# Patient Record
Sex: Female | Born: 1965 | Race: Black or African American | Hispanic: No | Marital: Married | Smoking: Never smoker
Health system: Southern US, Community
[De-identification: ages and names within clinical notes are randomized; demographics above are authoritative.]

## PROBLEM LIST (undated history)

## (undated) DIAGNOSIS — F1021 Alcohol dependence, in remission: Secondary | ICD-10-CM

## (undated) DIAGNOSIS — F419 Anxiety disorder, unspecified: Secondary | ICD-10-CM

## (undated) HISTORY — PX: GASTRIC BYPASS: SHX52

## (undated) HISTORY — PX: NO PAST SURGERIES: SHX2092

---

## 1998-04-09 ENCOUNTER — Other Ambulatory Visit: Admission: RE | Admit: 1998-04-09 | Discharge: 1998-04-09 | Payer: Self-pay | Admitting: Gynecology

## 1998-09-06 ENCOUNTER — Other Ambulatory Visit: Admission: RE | Admit: 1998-09-06 | Discharge: 1998-09-06 | Payer: Self-pay | Admitting: Gynecology

## 1999-01-22 ENCOUNTER — Other Ambulatory Visit: Admission: RE | Admit: 1999-01-22 | Discharge: 1999-01-22 | Payer: Self-pay | Admitting: *Deleted

## 2000-05-13 ENCOUNTER — Other Ambulatory Visit: Admission: RE | Admit: 2000-05-13 | Discharge: 2000-05-13 | Payer: Self-pay | Admitting: Gynecology

## 2001-05-11 ENCOUNTER — Other Ambulatory Visit: Admission: RE | Admit: 2001-05-11 | Discharge: 2001-05-11 | Payer: Self-pay | Admitting: Gynecology

## 2002-09-07 ENCOUNTER — Other Ambulatory Visit: Admission: RE | Admit: 2002-09-07 | Discharge: 2002-09-07 | Payer: Self-pay | Admitting: Gynecology

## 2007-06-12 ENCOUNTER — Emergency Department: Payer: Self-pay | Admitting: Emergency Medicine

## 2008-04-30 ENCOUNTER — Ambulatory Visit: Payer: Self-pay | Admitting: Unknown Physician Specialty

## 2008-05-03 ENCOUNTER — Emergency Department: Payer: Self-pay | Admitting: Emergency Medicine

## 2008-08-31 ENCOUNTER — Ambulatory Visit: Payer: Self-pay | Admitting: Internal Medicine

## 2008-09-04 ENCOUNTER — Ambulatory Visit: Payer: Self-pay | Admitting: Family Medicine

## 2008-09-13 ENCOUNTER — Encounter: Payer: Self-pay | Admitting: *Deleted

## 2008-09-25 ENCOUNTER — Encounter: Payer: Self-pay | Admitting: *Deleted

## 2008-10-25 ENCOUNTER — Encounter: Payer: Self-pay | Admitting: *Deleted

## 2008-11-01 ENCOUNTER — Ambulatory Visit: Payer: Self-pay | Admitting: Internal Medicine

## 2009-05-06 ENCOUNTER — Ambulatory Visit: Payer: Self-pay | Admitting: Family Medicine

## 2010-04-26 ENCOUNTER — Ambulatory Visit: Payer: Self-pay | Admitting: General Practice

## 2014-01-27 ENCOUNTER — Ambulatory Visit: Payer: Self-pay | Admitting: Internal Medicine

## 2014-01-30 ENCOUNTER — Ambulatory Visit: Payer: Self-pay | Admitting: Family Medicine

## 2014-10-16 ENCOUNTER — Emergency Department
Admission: EM | Admit: 2014-10-16 | Discharge: 2014-10-16 | Disposition: A | Discharge status: Home / Self Care | Payer: No Typology Code available for payment source | Attending: Emergency Medicine | Admitting: Emergency Medicine

## 2014-10-16 ENCOUNTER — Encounter: Payer: Self-pay | Admitting: Emergency Medicine

## 2014-10-16 DIAGNOSIS — S161XXA Strain of muscle, fascia and tendon at neck level, initial encounter: Secondary | ICD-10-CM | POA: Diagnosis not present

## 2014-10-16 DIAGNOSIS — Y92410 Unspecified street and highway as the place of occurrence of the external cause: Secondary | ICD-10-CM | POA: Insufficient documentation

## 2014-10-16 DIAGNOSIS — S39012A Strain of muscle, fascia and tendon of lower back, initial encounter: Secondary | ICD-10-CM | POA: Insufficient documentation

## 2014-10-16 DIAGNOSIS — Y998 Other external cause status: Secondary | ICD-10-CM | POA: Insufficient documentation

## 2014-10-16 DIAGNOSIS — S199XXA Unspecified injury of neck, initial encounter: Secondary | ICD-10-CM | POA: Diagnosis present

## 2014-10-16 DIAGNOSIS — Y9389 Activity, other specified: Secondary | ICD-10-CM | POA: Diagnosis not present

## 2014-10-16 MED ORDER — TRAMADOL HCL 50 MG PO TABS: 50.0000 mg | ORAL_TABLET | Freq: Two times a day (BID) | ORAL | Status: DC

## 2014-10-16 MED ORDER — KETOROLAC TROMETHAMINE 10 MG PO TABS: 10.0000 mg | ORAL_TABLET | Freq: Three times a day (TID) | ORAL | Status: DC
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
## 2014-10-28 ENCOUNTER — Ambulatory Visit
Admission: EM | Admit: 2014-10-28 | Discharge: 2014-10-28 | Disposition: A | Discharge status: Home / Self Care | Payer: Managed Care, Other (non HMO)

## 2014-10-28 ENCOUNTER — Encounter: Payer: Self-pay | Admitting: Emergency Medicine

## 2014-10-28 DIAGNOSIS — M791 Myalgia, unspecified site: Secondary | ICD-10-CM
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
## 2014-11-09 ENCOUNTER — Other Ambulatory Visit: Payer: Self-pay | Admitting: Chiropractor

## 2014-11-09 ENCOUNTER — Ambulatory Visit
Admission: RE | Admit: 2014-11-09 | Discharge: 2014-11-09 | Disposition: A | Discharge status: Home / Self Care | Payer: No Typology Code available for payment source | Source: Ambulatory Visit | Attending: Chiropractor | Admitting: Chiropractor

## 2014-11-09 DIAGNOSIS — R262 Difficulty in walking, not elsewhere classified: Secondary | ICD-10-CM | POA: Diagnosis not present

## 2014-11-09 DIAGNOSIS — G44319 Acute post-traumatic headache, not intractable: Secondary | ICD-10-CM

## 2014-11-09 DIAGNOSIS — R51 Headache: Secondary | ICD-10-CM | POA: Insufficient documentation

## 2014-11-09 DIAGNOSIS — R110 Nausea: Secondary | ICD-10-CM

## 2014-11-10 ENCOUNTER — Ambulatory Visit
Admission: RE | Admit: 2014-11-10 | Discharge: 2014-11-10 | Disposition: A | Discharge status: Home / Self Care | Payer: No Typology Code available for payment source | Source: Ambulatory Visit | Attending: Chiropractor | Admitting: Chiropractor

## 2014-11-10 ENCOUNTER — Other Ambulatory Visit: Payer: Self-pay | Admitting: Chiropractor

## 2014-11-10 DIAGNOSIS — M25562 Pain in left knee: Secondary | ICD-10-CM | POA: Diagnosis present

## 2014-11-10 DIAGNOSIS — S99911A Unspecified injury of right ankle, initial encounter: Secondary | ICD-10-CM

## 2014-11-10 DIAGNOSIS — M25561 Pain in right knee: Secondary | ICD-10-CM | POA: Diagnosis present

## 2014-11-10 DIAGNOSIS — M4316 Spondylolisthesis, lumbar region: Secondary | ICD-10-CM | POA: Insufficient documentation

## 2014-11-10 DIAGNOSIS — M25572 Pain in left ankle and joints of left foot: Secondary | ICD-10-CM | POA: Insufficient documentation

## 2014-11-10 DIAGNOSIS — M542 Cervicalgia: Secondary | ICD-10-CM

## 2014-11-10 DIAGNOSIS — M546 Pain in thoracic spine: Secondary | ICD-10-CM

## 2014-11-10 DIAGNOSIS — S99912A Unspecified injury of left ankle, initial encounter: Secondary | ICD-10-CM

## 2014-11-10 DIAGNOSIS — M545 Low back pain: Secondary | ICD-10-CM

## 2014-11-10 DIAGNOSIS — M7731 Calcaneal spur, right foot: Secondary | ICD-10-CM | POA: Insufficient documentation

## 2014-11-10 DIAGNOSIS — M25571 Pain in right ankle and joints of right foot: Secondary | ICD-10-CM | POA: Diagnosis not present

## 2014-11-10 DIAGNOSIS — M47816 Spondylosis without myelopathy or radiculopathy, lumbar region: Secondary | ICD-10-CM | POA: Diagnosis not present

## 2014-11-10 DIAGNOSIS — S8991XA Unspecified injury of right lower leg, initial encounter: Secondary | ICD-10-CM

## 2014-11-10 DIAGNOSIS — S8992XA Unspecified injury of left lower leg, initial encounter: Secondary | ICD-10-CM

## 2015-08-23 ENCOUNTER — Ambulatory Visit
Admission: RE | Admit: 2015-08-23 | Discharge: 2015-08-23 | Disposition: A | Discharge status: Home / Self Care | Payer: No Typology Code available for payment source | Source: Ambulatory Visit | Attending: Family Medicine | Admitting: Family Medicine

## 2015-08-23 ENCOUNTER — Other Ambulatory Visit: Payer: Self-pay | Admitting: Family Medicine

## 2015-08-23 DIAGNOSIS — Z9889 Other specified postprocedural states: Secondary | ICD-10-CM | POA: Insufficient documentation

## 2015-08-23 DIAGNOSIS — R52 Pain, unspecified: Secondary | ICD-10-CM

## 2015-08-23 DIAGNOSIS — D259 Leiomyoma of uterus, unspecified: Secondary | ICD-10-CM | POA: Insufficient documentation

## 2015-08-23 DIAGNOSIS — M25552 Pain in left hip: Secondary | ICD-10-CM | POA: Insufficient documentation

## 2016-09-02 ENCOUNTER — Ambulatory Visit
Admission: RE | Admit: 2016-09-02 | Discharge: 2016-09-02 | Disposition: A | Discharge status: Home / Self Care | Payer: Self-pay | Source: Ambulatory Visit | Attending: General Surgery | Admitting: General Surgery

## 2016-09-02 ENCOUNTER — Other Ambulatory Visit: Payer: Self-pay | Admitting: Physician Assistant

## 2016-09-02 ENCOUNTER — Ambulatory Visit
Admission: RE | Admit: 2016-09-02 | Discharge: 2016-09-02 | Disposition: A | Discharge status: Home / Self Care | Payer: No Typology Code available for payment source | Source: Ambulatory Visit | Attending: Physician Assistant | Admitting: Physician Assistant

## 2016-09-02 DIAGNOSIS — S6992XA Unspecified injury of left wrist, hand and finger(s), initial encounter: Secondary | ICD-10-CM | POA: Diagnosis not present

## 2016-09-02 DIAGNOSIS — R937 Abnormal findings on diagnostic imaging of other parts of musculoskeletal system: Secondary | ICD-10-CM | POA: Diagnosis not present

## 2016-09-02 DIAGNOSIS — X58XXXA Exposure to other specified factors, initial encounter: Secondary | ICD-10-CM | POA: Diagnosis not present

## 2016-09-28 ENCOUNTER — Ambulatory Visit
Admission: EM | Admit: 2016-09-28 | Discharge: 2016-09-28 | Disposition: A | Discharge status: Home / Self Care | Payer: 59 | Attending: Family Medicine | Admitting: Family Medicine

## 2016-09-28 DIAGNOSIS — S01511A Laceration without foreign body of lip, initial encounter: Secondary | ICD-10-CM

## 2016-09-28 DIAGNOSIS — S00531A Contusion of lip, initial encounter: Secondary | ICD-10-CM

## 2016-09-28 MED ORDER — AMOXICILLIN-POT CLAVULANATE 875-125 MG PO TABS: 1.0000 | ORAL_TABLET | Freq: Two times a day (BID) | ORAL | 0 refills | Status: DC

## 2016-09-28 MED ORDER — MUPIROCIN 2 % EX OINT: TOPICAL_OINTMENT | CUTANEOUS | 0 refills | Status: DC

## 2016-09-28 MED ORDER — HYDROCODONE-ACETAMINOPHEN 5-325 MG PO TABS: 1.0000 | ORAL_TABLET | Freq: Three times a day (TID) | ORAL | 0 refills | Status: DC | PRN
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
## 2016-09-28 NOTE — Discharge Instructions (Signed)
Take medication as prescribed. Rest. Drink plenty of fluids. Rinse mouth frequently. Keep clean. Monitor closely.   Follow up with your primary care physician or Ear Nose Throat in 2-3 days as needed. Return to Urgent care for new or worsening concerns.

## 2016-09-28 NOTE — ED Provider Notes (Signed)
[facility] ____________________________________________  Time seen: Approximately 9:12 AM  I have reviewed the triage vital signs and the nursing notes.   HISTORY  Chief Complaint Lip Laceration   HPI Brittany Holder is a 51 y.o. female  presenting for evaluation of lip laceration that occurred 2 days ago. Patient states that Saturday evening she was goofing off and playing with her dog area patient states that she tripped over the dog and hit the door frame with her lip. States only injury to her lip. Denies actual head injury or loss of consciousness. Denies any dental injury or bony injury. Denies any jaw, mandible or other facial tenderness. States her lip does have a throbbing pain, 7 out of 10. States has occasionally taken some over-the-counter ibuprofen and use peroxide to clean the area. Denies other alleviating measures being taken. Has not had this seen or looked at prior to arrival today. Denies other injury. Denies other complaints.  Denies chest pain, shortness of breath, abdominal pain, dysuria, extremity pain, extremity swelling or rash. Denies recent sickness. Denies recent antibiotic use.   Patient's last menstrual period was 09/14/2016. Denies pregnancy Reports last Tetanus immunization: Patient states unsure of last tetanus immunization, believes it may be up to date.  Sofie Hartigan, MD: PCP   History reviewed. No pertinent past medical history.  There are no active problems to display for this patient.   Past Surgical History:  Procedure Laterality Date  . NO PAST SURGERIES       No current facility-administered medications for this encounter.   Current Outpatient Prescriptions:  .  amoxicillin-clavulanate (AUGMENTIN) 875-125 MG tablet, Take 1 tablet by mouth every 12 (twelve) hours., Disp: 20 tablet, Rfl: 0 .  HYDROcodone-acetaminophen (NORCO/VICODIN) 5-325 MG tablet, Take 1 tablet by mouth every 8 (eight) hours as needed for  moderate pain or severe pain (do not drive or operate machinery while taking as can cause drowsiness)., Disp: 3 tablet, Rfl: 0 .  mupirocin ointment (BACTROBAN) 2 %, Apply three times a day for 5 days., Disp: 22 g, Rfl: 0  Allergies Patient has no known allergies.  History reviewed. No pertinent family history.  Social History Social History  Substance Use Topics  . Smoking status: Never Smoker  . Smokeless tobacco: Never Used  . Alcohol use No    Review of Systems Constitutional: No fever/chills Eyes: No visual changes. ENT: No sore throat. Cardiovascular: Denies chest pain. Respiratory: Denies shortness of breath. Gastrointestinal: No abdominal pain.  No nausea, no vomiting.  No diarrhea.  No constipation. Musculoskeletal: Negative for back pain. Skin: As above. Denies other skin injuries.  Neurological: Negative for headaches, focal weakness or numbness.    ____________________________________________   PHYSICAL EXAM:  VITAL SIGNS: ED Triage Vitals  Enc Vitals Group     BP 09/28/16 0853 118/80     Pulse Rate 09/28/16 0853 77     Resp 09/28/16 0853 18     Temp 09/28/16 0853 98.2 F (36.8 C)     Temp Source 09/28/16 0853 Oral     SpO2 09/28/16 0853 100 %     Weight 09/28/16 0851 235 lb (106.6 kg)     Height 09/28/16 0851 5\' 8"  (1.727 m)     Head Circumference --      Peak Flow --      Pain Score 09/28/16 0851 8     Pain Loc --      Pain Edu? --      Excl. in [place]? --  Constitutional: Alert and oriented. Well appearing and in no acute distress. Eyes: Conjunctivae are normal. PERRL.  ENT      Head: Normocephalic. Nontender.       Ears: no erythema, normal TMs bilaterally. No surrounding tenderness, erythema or swelling bilaterally.       Nose: No congestion/rhinnorhea. Nontender.      Mouth/Throat: Mucous membranes are moist.Oropharynx non-erythematous. No dental tenderness or loose teeth noted. Bottom lip with healing laceration present, laceration  external lower mid lip approximately 2 cm and extends just inferiorly past vermilion border with crusted lesion present. Inner lower mid lip with healing laceration with whitish tissue base approximately 1.5 cm. Neither wounds with any surrounding erythema, or drainage. Mild to moderate tenderness to lower lip laceration site only, with induration palpated between lacerations. Unable to determine if through and through laceration. No other facial or surrounding tenderness.   Neck: No stridor. Supple without meningismus.  Hematological/Lymphatic/Immunilogical: No cervical lymphadenopathy. Cardiovascular: Normal rate, regular rhythm. Grossly normal heart sounds.  Good peripheral circulation. Respiratory: Normal respiratory effort without tachypnea nor retractions. Breath sounds are clear and equal bilaterally. No wheezes, rales, rhonchi. Musculoskeletal:  Steady gait. No midline cervical, thoracic or lumbar tenderness to palpation.  Neurologic:  Normal speech and language. No gross focal neurologic deficits are appreciated. Speech is normal. No gait instability.  Skin:  Skin is warm, dry. Psychiatric: Mood and affect are normal. Speech and behavior are normal. Patient exhibits appropriate insight and judgment   ___________________________________________   LABS (all labs ordered are listed, but only abnormal results are displayed)  Labs Reviewed - No data to display   PROCEDURES Procedures   INITIAL IMPRESSION / ASSESSMENT AND PLAN / ED COURSE  Pertinent labs & imaging results that were available during my care of the patient were reviewed by me and considered in my medical decision making (see chart for details).  Well appearing patient. No acute distress. Lip laceration sustained 2 days ago. Concerned laceration was through and through but unable to determine. Laceration through vermilion border. No repair indicated at this time. Concerned for secondary infection. Will start patient on oral  Augmentin and topical Bactroban for area of lower vermilion border. Discussed keeping area clean and rinsing frequently. Will encourage primary care or Ear nose and throat follow-up in 2-3 days. Patient states having some pain when trying to go to sleep at night and Norco quantity 3 given. Discussed indication, risks and benefits of medications with patient.    [place] controlled substance database reviewed for last one year, and most recent controlled substance documented 09/11/2016 quantity #3 10 mg Norco given.  Discussed follow up with Primary care physician this week. Discussed follow up and return parameters including no resolution or any worsening concerns. Patient verbalized understanding and agreed to plan.   Per Izora Gala RN, patient stated that she believes tetanus immunization was up-to-date and if not will return for tetanus immunization.  ____________________________________________   FINAL CLINICAL IMPRESSION(S) / ED DIAGNOSES  Final diagnoses:  Lip laceration, initial encounter  Contusion of lip, initial encounter     Discharge Medication List as of 09/28/2016  9:23 AM    START taking these medications   Details  amoxicillin-clavulanate (AUGMENTIN) 875-125 MG tablet Take 1 tablet by mouth every 12 (twelve) hours., Starting Mon 09/28/2016, Normal    HYDROcodone-acetaminophen (NORCO/VICODIN) 5-325 MG tablet Take 1 tablet by mouth every 8 (eight) hours as needed for moderate pain or severe pain (do not drive or operate machinery while taking as  can cause drowsiness)., Starting Mon 09/28/2016, Print    mupirocin ointment (BACTROBAN) 2 % Apply three times a day for 5 days., Normal        Note: This dictation was prepared with Dragon dictation along with smaller phrase technology. Any transcriptional errors that result from this process are unintentional.         Marylene Land, NP 10/01/16 1253

## 2016-10-01 ENCOUNTER — Telehealth: Payer: Self-pay | Admitting: Emergency Medicine

## 2016-10-01 NOTE — Telephone Encounter (Signed)
Attempted to call patient to follow-up, and to follow-up determining if patient tetanus immunization is up-to-date. Izora Gala RN, reported that patient called back on date of visit and mentioned that tetanus immunization may not be up-to-date and was going to stop back by the urgent care for immunization. No note found that patient returned. I have tried calling the patient 3 times and have been unable to reach. Message left. Unclear if tetanus immunization is up-to-date.

## 2017-01-10 ENCOUNTER — Encounter: Payer: Self-pay | Admitting: Gynecology

## 2017-01-10 ENCOUNTER — Ambulatory Visit
Admission: EM | Admit: 2017-01-10 | Discharge: 2017-01-10 | Disposition: A | Discharge status: Home / Self Care | Payer: BLUE CROSS/BLUE SHIELD | Attending: Family Medicine | Admitting: Family Medicine

## 2017-01-10 DIAGNOSIS — F419 Anxiety disorder, unspecified: Secondary | ICD-10-CM | POA: Diagnosis not present

## 2017-01-10 HISTORY — DX: Anxiety disorder, unspecified: F41.9

## 2017-01-10 HISTORY — DX: Alcohol dependence, in remission: F10.21

## 2017-01-10 MED ORDER — ALPRAZOLAM 0.25 MG PO TABS: 0.2500 mg | ORAL_TABLET | Freq: Three times a day (TID) | ORAL | 0 refills | Status: DC | PRN

## 2017-01-10 NOTE — ED Provider Notes (Addendum)
[facility] ____________________________________________  Time seen: Approximately 2:40 PM  I have reviewed the triage vital signs and the nursing notes.   HISTORY  Chief Complaint Anxiety   HPI Brittany Holder is a 51 y.o. female presenting for evaluation of her anxiety. Patient states she has chronic anxiety, and reports that she hasn't been under more stress recently at work and feels that this is worsening her anxiety and stress levels. Denies home stress related triggers. 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 Patient reports that she used to take daily Prozac, however reports that she stopped this about a month ago as she reports it had not been working for her. States also in the past she has taken Vistaril with minimal improvement as well as Xanax. Patient reports Xanax worked where very well for her in the past. States that she has a follow-up with her primary care doctor this Wednesday for the same complaints. Patient states her anxiety causes her to feel jittery and that her heart rate increases, but once her anxiety improved symptoms fully resolved. Denies any chest pain, shortness of breath or other complaints. Patient states this is her normal presentation for anxiety. Denies suicidal or homicidal ideation. Reports continues to eat and drink well. Denies recent sickness, cough, congestion or fevers.  Denies chest pain, shortness of breath, abdominal pain, extremity pain, extremity swelling. Denies recent sickness. Denies recent antibiotic use. States does not drink alcohol. Denies drug use.    Past Medical History:  Diagnosis Date  . Alcoholism in recovery ([place])   . Anxiety     There are no active problems to display for this patient.   Past Surgical History:  Procedure Laterality Date  . GASTRIC BYPASS    . NO PAST SURGERIES      No current facility-administered medications for this encounter.    Current Outpatient Prescriptions:  .  FLUoxetine (PROZAC) 40 MG capsule, Take 40 mg by mouth daily., Disp: , Rfl:  .  ALPRAZolam (XANAX) 0.25 MG tablet, Take 1 tablet (0.25 mg total) by mouth 3 (three) times daily as needed for anxiety., Disp: 8 tablet, Rfl: 0 .  amoxicillin-clavulanate (AUGMENTIN) 875-125 MG tablet, Take 1 tablet by mouth every 12 (twelve) hours., Disp: 20 tablet, Rfl: 0 .  HYDROcodone-acetaminophen (NORCO/VICODIN) 5-325 MG tablet, Take 1 tablet by mouth every 8 (eight) hours as needed for moderate pain or severe pain (do not drive or operate machinery while taking as can cause drowsiness)., Disp: 3 tablet, Rfl: 0 .  mupirocin ointment (BACTROBAN) 2 %, Apply three times a day for 5 days., Disp: 22 g, Rfl: 0  Allergies Patient has no known allergies.  No family history on file.  Social History Social History  Substance Use Topics  . Smoking status: Never Smoker  . Smokeless tobacco: Never Used  . Alcohol use No    Review of Systems Constitutional: No fever/chills Cardiovascular: Denies chest pain. Respiratory: Denies shortness of breath. Gastrointestinal: No abdominal pain.  No nausea, no vomiting.   Genitourinary: Negative for dysuria. Musculoskeletal: Negative for back pain. Skin: Negative for rash. Neurological: Negative for headaches, focal weakness or numbness.  ____________________________________________   PHYSICAL EXAM:  VITAL SIGNS: ED Triage Vitals  Enc Vitals Group     BP 01/10/17 1421 107/66     Pulse Rate 01/10/17 1421 99     Resp 01/10/17 1421 16     Temp 01/10/17 1421 98.1 F (36.7 C)  Temp Source 01/10/17 1421 Oral     SpO2 01/10/17 1421 100 %     Weight 01/10/17 1418 235 lb (106.6 kg)     Height 01/10/17 1418 5\' 8"  (1.727 m)     Head Circumference --      Peak Flow --      Pain Score 01/10/17 1418 7     Pain Loc --      Pain Edu? --      Excl. in [place]? --     Constitutional: Alert and oriented. Well appearing and in no  acute distress. Cardiovascular: Normal rate, regular rhythm. Grossly normal heart sounds.  Good peripheral circulation. Respiratory: Normal respiratory effort without tachypnea nor retractions. Breath sounds are clear and equal bilaterally. No wheezes, rales, rhonchi. Musculoskeletal: Steady gait. No midline cervical, thoracic or lumbar tenderness to palpation.  Neurologic:  Normal speech and language.  Speech is normal. No gait instability.  Skin:  Skin is warm, dry. Psychiatric: Mood and affect are normal. Speech and behavior are normal. Patient exhibits appropriate insight and judgment   ___________________________________________   LABS (all labs ordered are listed, but only abnormal results are displayed)  Labs Reviewed - No data to display  PROCEDURES Procedures    INITIAL IMPRESSION / ASSESSMENT AND PLAN / ED COURSE  Pertinent labs & imaging results that were available during my care of the patient were reviewed by me and considered in my medical decision making (see chart for details).  Very well-appearing patient. No acute distress. Patient with chronic anxiety, and reports recent increase in her anxiety related to stress at work. Patient had recently stopped her Prozac today stating that it was not working for her. Patient has appointment with PCP this week, encouraged to keep this appointment. Patient states current complaints consistent with her anxiety. Declines ecg, and states "its only my anxiety." We'll give quantity 8 0.25 Xanax tablets, and discussed these sparingly. Encouraged rest, fluids, daily exercise and avoidance of stressful triggers as able.Discussed indication, risks and benefits of medications with patient, including do not drive while taking, or consume with alcohol or pain medication.    [place] controlled substance database reviewed, and most recent controlled medications documented 12/16/16 # 15 hydrocodone and 10/11/16 # 3 hydrocodone.  Discussed  follow up with Primary care physician this week. Discussed follow up and return parameters including no resolution or any worsening concerns. Patient verbalized understanding and agreed to plan.   ____________________________________________   FINAL CLINICAL IMPRESSION(S) / ED DIAGNOSES  Final diagnoses:  Anxiety     Discharge Medication List as of 01/10/2017  2:54 PM    START taking these medications   Details  ALPRAZolam (XANAX) 0.25 MG tablet Take 1 tablet (0.25 mg total) by mouth 3 (three) times daily as needed for anxiety., Starting Sun 01/10/2017, Print        Note: This dictation was prepared with Dragon dictation along with smaller phrase technology. Any transcriptional errors that result from this process are unintentional.         Marylene Land, NP 01/10/17 [place], [place], NP 01/10/17 [phone]

## 2017-01-10 NOTE — ED Triage Notes (Signed)
Per patient seen by her primary care in August for anxiety. Per patient was put on Prozac 40 mg, which is no tworking for her. Patient stated she use to be on Xanax which work better for her.

## 2017-01-10 NOTE — Discharge Instructions (Signed)
Take medication as prescribed. Rest. Drink plenty of fluids. Avoid stress triggers as able. Exercise regularly.  Follow up with your primary care physician this week as scheduled. Return to Urgent care for new or worsening concerns.

## 2017-06-09 ENCOUNTER — Telehealth: Payer: Self-pay | Admitting: Emergency Medicine

## 2017-06-09 ENCOUNTER — Ambulatory Visit
Admission: EM | Admit: 2017-06-09 | Discharge: 2017-06-09 | Disposition: A | Discharge status: Home / Self Care | Payer: BLUE CROSS/BLUE SHIELD | Attending: Family Medicine | Admitting: Family Medicine

## 2017-06-09 ENCOUNTER — Other Ambulatory Visit: Payer: Self-pay

## 2017-06-09 DIAGNOSIS — S61451A Open bite of right hand, initial encounter: Secondary | ICD-10-CM | POA: Diagnosis not present

## 2017-06-09 DIAGNOSIS — W540XXA Bitten by dog, initial encounter: Secondary | ICD-10-CM

## 2017-06-09 MED ORDER — METRONIDAZOLE 500 MG PO TABS: 500.0000 mg | ORAL_TABLET | Freq: Three times a day (TID) | ORAL | 0 refills | Status: DC

## 2017-06-09 MED ORDER — TRAMADOL HCL 50 MG PO TABS: 50.0000 mg | ORAL_TABLET | Freq: Three times a day (TID) | ORAL | 0 refills | Status: DC | PRN

## 2017-06-09 MED ORDER — DOXYCYCLINE HYCLATE 100 MG PO CAPS: 100.0000 mg | ORAL_CAPSULE | Freq: Two times a day (BID) | ORAL | 0 refills | Status: DC

## 2017-06-09 NOTE — ED Triage Notes (Signed)
Pt was horse playing with some dogs in [employer] in [place] and was bit on Saturday. She rates the pain a 8/10, right is swollen and has some old drainage present.

## 2017-06-09 NOTE — ED Provider Notes (Signed)
[facility]    CSN: [id number] Arrival date & time: 06/09/17  1748  History   Chief Complaint Dog bite.  HPI  52 year old female presents with dog bite.  Patient states that on Saturday she was at a local pet store and was bit in the palm of her hand by a dog.  She currently has an open wound in the palm of her hand.  She states that her pain is 8/10 in severity.  Mild swelling.  Ongoing drainage.  No fevers or chills.  She is been taking some leftover Augmentin without resolution.  No known exacerbating factors.  No other associated symptoms.  No other complaints at time.  Past Medical History:  Diagnosis Date  . Alcoholism in recovery ([place])   . Anxiety    Past Surgical History:  Procedure Laterality Date  . GASTRIC BYPASS           OB History    No data available       Home Medications    Prior to Admission medications   Medication Sig Start Date End Date Taking? Authorizing Provider  ALPRAZolam (XANAX) 0.25 MG tablet Take 1 tablet (0.25 mg total) by mouth 3 (three) times daily as needed for anxiety. 01/10/17   Marylene Land, NP  doxycycline (VIBRAMYCIN) 100 MG capsule Take 1 capsule (100 mg total) by mouth 2 (two) times daily. 06/09/17   Coral Spikes, DO  FLUoxetine (PROZAC) 40 MG capsule Take 40 mg by mouth daily.    [provider]  metroNIDAZOLE (FLAGYL) 500 MG tablet Take 1 tablet (500 mg total) by mouth 3 (three) times daily. 06/09/17   Coral Spikes, DO  traMADol (ULTRAM) 50 MG tablet Take 1 tablet (50 mg total) by mouth every 8 (eight) hours as needed. 06/09/17   Coral Spikes, DO   Family History History reviewed. No pertinent family history.  Social History Social History   Tobacco Use  . Smoking status: Never Smoker  . Smokeless tobacco: Never Used  Substance Use Topics  . Alcohol use: No  . Drug use: No     Allergies   Patient has no known allergies.   Review of Systems Review of Systems  Constitutional: Negative.     Skin: Positive for wound.   Physical Exam Triage Vital Signs ED Triage Vitals  Enc Vitals Group     BP 06/09/17 1823 140/86     Pulse Rate 06/09/17 1823 78     Resp --      Temp 06/09/17 1823 98.1 F (36.7 C)     Temp Source 06/09/17 1823 Oral     SpO2 06/09/17 1823 100 %     Weight 06/09/17 1826 240 lb (108.9 kg)     Height --      Head Circumference --      Peak Flow --      Pain Score 06/09/17 1826 8     Pain Loc --      Pain Edu? --      Excl. in [place]? --    Updated Vital Signs BP 140/86 (BP Location: Left Arm)   Pulse 78   Temp 98.1 F (36.7 C) (Oral)   Wt 240 lb (108.9 kg)   SpO2 100%   BMI 36.49 kg/m   Physical Exam  Constitutional: She is oriented to person, place, and time. She appears well-developed and well-nourished. No distress.  Cardiovascular: Normal rate and regular rhythm.  Pulmonary/Chest: Effort normal and breath sounds  normal.  Neurological: She is alert and oriented to person, place, and time.  Skin:  Right hand with a 2 cm open wound with drainage.  Mild swelling.  Minimal erythema.  Psychiatric: She has a normal mood and affect. Her behavior is normal.  Nursing note and vitals reviewed.  UC Treatments / Results  Labs (all labs ordered are listed, but only abnormal results are displayed) Labs Reviewed - No data to display  EKG  EKG Interpretation None       Radiology No results found.  Procedures Procedures (including critical care time)  Medications Ordered in UC Medications - No data to display   Initial Impression / Assessment and Plan / UC Course  I have reviewed the triage vital signs and the nursing notes.  Pertinent labs & imaging results that were available during my care of the patient were reviewed by me and considered in my medical decision making (see chart for details).     52 year old female presents with a dog bite/wound.  I informed her that closure is not a good option at this time.  Placing on doxycycline  and Flagyl.  Tramadol for pain.  If she fails to improve or worsens, she will need surgical consultation.  Final Clinical Impressions(s) / UC Diagnoses   Final diagnoses:  Dog bite, initial encounter    ED Discharge Orders        Ordered    doxycycline (VIBRAMYCIN) 100 MG capsule  2 times daily     06/09/17 1850    metroNIDAZOLE (FLAGYL) 500 MG tablet  3 times daily     06/09/17 1850    traMADol (ULTRAM) 50 MG tablet  Every 8 hours PRN     06/09/17 1851     Controlled Substance Prescriptions [place] Controlled Substance Registry consulted? No   Coral Spikes, [place] 06/09/17 4008

## 2017-06-09 NOTE — Discharge Instructions (Signed)
If no improvement in the next 2 days, you need to go to the hospital.  Antibiotics as prescribed.  Take care  Dr. Lacinda Axon

## 2017-06-09 NOTE — Telephone Encounter (Signed)
Pharmacist called stating patient was there to pick up prescriptions written by Dr. Lacinda Axon (Flagyl and Ultram) and that there is an interaction between Antabuse 250mg  and Flagyl. Advised pharmacy she did not tell us that she was on Antabuse. Pharmacy also stated that she had two narcotic pain medication prescriptions filled on 05/28/17 and 06/04/17 for 5 days supply each from a dentist. Spoke with Dr. Lacinda Axon, he stated to cancel Flagyl prescription and the Ultram prescription. Pharmacist agreed and voiced understanding. He will advise patient. Antabuse 250mg  daily added to patient's medication list.
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
## 2019-07-28 ENCOUNTER — Ambulatory Visit
Admission: EM | Admit: 2019-07-28 | Discharge: 2019-07-28 | Disposition: A | Discharge status: Home / Self Care | Payer: BC Managed Care – PPO | Attending: Family Medicine | Admitting: Family Medicine

## 2019-07-28 ENCOUNTER — Encounter: Payer: Self-pay | Admitting: Emergency Medicine

## 2019-07-28 ENCOUNTER — Other Ambulatory Visit: Payer: Self-pay

## 2019-07-28 DIAGNOSIS — R1013 Epigastric pain: Secondary | ICD-10-CM

## 2019-07-28 LAB — BASIC METABOLIC PANEL
Anion gap: 15 (ref 5–15)
BUN: 19 mg/dL (ref 6–20)
CO2: 19 mmol/L — ABNORMAL LOW (ref 22–32)
Calcium: 8.3 mg/dL — ABNORMAL LOW (ref 8.9–10.3)
Chloride: 100 mmol/L (ref 98–111)
Creatinine, Ser: 0.85 mg/dL (ref 0.44–1.00)
GFR calc Af Amer: >60 mL/min (ref >60)
GFR calc non Af Amer: >60 mL/min (ref >60)
Glucose, Bld: 97 mg/dL (ref 70–99)
Potassium: 4 mmol/L (ref 3.5–5.1)
Sodium: 134 mmol/L — ABNORMAL LOW (ref 135–145)

## 2019-07-28 LAB — LIPASE, BLOOD: Lipase: 15 U/L (ref 11–51)

## 2019-07-28 MED ORDER — LIDOCAINE VISCOUS HCL 2 % MT SOLN
15.0000 mL | Freq: Once | OROMUCOSAL | Status: AC
  Administered 2019-07-28: 15 mL via ORAL

## 2019-07-28 MED ORDER — ALUM & MAG HYDROXIDE-SIMETH 200-200-20 MG/5ML PO SUSP
30.0000 mL | Freq: Once | ORAL | Status: AC
  Administered 2019-07-28: 30 mL via ORAL

## 2019-07-28 NOTE — Discharge Instructions (Signed)
Over the counter Prilosec or Nexium

## 2019-07-28 NOTE — ED Triage Notes (Addendum)
Patient c/o upped mid abdominal pain and nausea that started yesterday.  Patient reports loose stools.  Patient denies vomiting.  Patient states that she is currently on her menstrual cycle.

## 2019-07-30 NOTE — ED Provider Notes (Addendum)
[facility]    CSN: [id number] Arrival date & time: 07/28/19  1115      History   Chief Complaint Chief Complaint  Patient presents with  . Abdominal Pain  . Nausea    HPI Brittany Holder is a 54 y.o. female.   54 yo female with a c/o upper mid abdominal pains and nausea since yesterday. Denies any fevers, chills, chest pains, shortness of breath, melena, hematochezia, vomiting. Reports mild loose stools. No known sick contacts or suspicious foods.    Abdominal Pain   Past Medical History:  Diagnosis Date  . Alcoholism in recovery ([place])   . Anxiety     There are no problems to display for this patient.   Past Surgical History:  Procedure Laterality Date  . GASTRIC BYPASS    . NO PAST SURGERIES      OB History   No obstetric history on file.      Home Medications    Prior to Admission medications   Medication Sig Start Date End Date Taking? Authorizing Provider  ALPRAZolam (XANAX) 0.25 MG tablet Take 1 tablet (0.25 mg total) by mouth 3 (three) times daily as needed for anxiety. 01/10/17   Marylene Land, NP  disulfiram (ANTABUSE) 250 MG tablet Take 250 mg by mouth daily.    [provider]  doxycycline (VIBRAMYCIN) 100 MG capsule Take 1 capsule (100 mg total) by mouth 2 (two) times daily. 06/09/17   Coral Spikes, DO  FLUoxetine (PROZAC) 40 MG capsule Take 40 mg by mouth daily.    [provider]  gabapentin (NEURONTIN) 800 MG tablet Take 800 mg by mouth 4 (four) times daily. 07/24/19   [provider]  hydrOXYzine (ATARAX/VISTARIL) 10 MG tablet Take 10 mg by mouth at bedtime. 07/12/19   [provider]    Family History Family History  Problem Relation Age of Onset  . Hypertension Mother     Social History Social History   Tobacco Use  . Smoking status: Never Smoker  . Smokeless tobacco: Never Used  Substance Use Topics  . Alcohol use: Yes  . Drug use: No     Allergies   Patient has no  known allergies.   Review of Systems Review of Systems  Gastrointestinal: Positive for abdominal pain.     Physical Exam Triage Vital Signs ED Triage Vitals  Enc Vitals Group     BP 07/28/19 1134 109/63     Pulse Rate 07/28/19 1134 (!) 101     Resp 07/28/19 1134 16     Temp 07/28/19 1134 98.3 F (36.8 C)     Temp src --      SpO2 07/28/19 1134 98 %     Weight 07/28/19 1129 265 lb (120.2 kg)     Height 07/28/19 1129 5\' 8"  (1.727 m)     Head Circumference --      Peak Flow --      Pain Score 07/28/19 1129 6     Pain Loc --      Pain Edu? --      Excl. in [place]? --    No data found.  Updated Vital Signs BP 109/63 (BP Location: Right Arm)   Pulse (!) 101   Temp 98.3 F (36.8 C)   Resp 16   Ht 5\' 8"  (1.727 m)   Wt 120.2 kg   LMP 07/26/2019 (Exact Date)   SpO2 98%   BMI 40.29 kg/m   Visual Acuity Right  Eye Distance:   Left Eye Distance:   Bilateral Distance:    Right Eye Near:   Left Eye Near:    Bilateral Near:     Physical Exam Vitals and nursing note reviewed.  Constitutional:      General: She is not in acute distress.    Appearance: She is not toxic-appearing or diaphoretic.  Abdominal:     General: Bowel sounds are normal. There is no distension.     Palpations: Abdomen is soft. There is no mass.     Tenderness: There is no abdominal tenderness. There is no right CVA tenderness, left CVA tenderness, guarding or rebound.     Hernia: No hernia is present.  Neurological:     Mental Status: She is alert.      UC Treatments / Results  Labs (all labs ordered are listed, but only abnormal results are displayed) Labs Reviewed  BASIC METABOLIC PANEL - Abnormal; Notable for the following components:      Result Value   Sodium 134 (*)    CO2 19 (*)    Calcium 8.3 (*)    All other components within normal limits  LIPASE, BLOOD    EKG   Radiology No results found.  Procedures Procedures (including critical care time)  Medications Ordered in  UC Medications  alum & mag hydroxide-simeth (MAALOX/MYLANTA) 200-200-20 MG/5ML suspension 30 mL (30 mLs Oral Given 07/28/19 1201)    And  lidocaine (XYLOCAINE) 2 % viscous mouth solution 15 mL (15 mLs Oral Given 07/28/19 1201)    Initial Impression / Assessment and Plan / UC Course  I have reviewed the triage vital signs and the nursing notes.  Pertinent labs & imaging results that were available during my care of the patient were reviewed by me and considered in my medical decision making (see chart for details).      Final Clinical Impressions(s) / UC Diagnoses   Final diagnoses:  Dyspepsia     Discharge Instructions     Over the counter Prilosec or Nexium      ED Prescriptions    None      1. Lab results and diagnosis reviewed with patient; GI cocktail given with improvement of symptoms 2. Recommend supportive treatment with otc H2 blocker or PPI 3. Follow-up prn if symptoms worsen or don't improve   PDMP not reviewed this encounter.   Norval Gable, MD 07/30/19 [place], [place], MD 07/30/19 1343

## 2019-12-12 ENCOUNTER — Other Ambulatory Visit: Payer: Self-pay | Admitting: Family Medicine

## 2019-12-12 DIAGNOSIS — Z1231 Encounter for screening mammogram for malignant neoplasm of breast: Secondary | ICD-10-CM

## 2020-03-12 ENCOUNTER — Inpatient Hospital Stay: Admission: RE | Admit: 2020-03-12 | Payer: BC Managed Care – PPO | Source: Ambulatory Visit

## 2020-05-01 ENCOUNTER — Inpatient Hospital Stay: Admission: RE | Admit: 2020-05-01 | Payer: BC Managed Care – PPO | Source: Ambulatory Visit

## 2020-05-29 ENCOUNTER — Inpatient Hospital Stay: Admission: RE | Admit: 2020-05-29 | Payer: Self-pay | Source: Ambulatory Visit

## 2020-06-13 ENCOUNTER — Inpatient Hospital Stay: Admission: RE | Admit: 2020-06-13 | Payer: Self-pay | Source: Ambulatory Visit

## 2020-08-12 ENCOUNTER — Encounter: Payer: Self-pay | Admitting: Emergency Medicine

## 2020-08-12 ENCOUNTER — Other Ambulatory Visit: Payer: Self-pay

## 2020-08-12 ENCOUNTER — Ambulatory Visit (INDEPENDENT_AMBULATORY_CARE_PROVIDER_SITE_OTHER): Payer: BC Managed Care – PPO

## 2020-08-12 ENCOUNTER — Ambulatory Visit
Admission: EM | Admit: 2020-08-12 | Discharge: 2020-08-12 | Disposition: A | Discharge status: Home / Self Care | Payer: BC Managed Care – PPO | Attending: Emergency Medicine | Admitting: Emergency Medicine

## 2020-08-12 DIAGNOSIS — M25561 Pain in right knee: Secondary | ICD-10-CM

## 2020-08-12 DIAGNOSIS — M25562 Pain in left knee: Secondary | ICD-10-CM

## 2020-08-12 MED ORDER — HYDROCODONE-ACETAMINOPHEN 10-300 MG PO TABS: 1.0000 | ORAL_TABLET | Freq: Four times a day (QID) | ORAL | 0 refills | Status: DC | PRN

## 2020-08-12 MED ORDER — MELOXICAM 15 MG PO TBDP: 1.0000 | ORAL_TABLET | Freq: Every day | ORAL | 1 refills | Status: AC

## 2020-08-12 NOTE — Discharge Instructions (Addendum)
Take the meloxicam 50 mg once daily.  Take this with food.  This will decrease inflammation and help you with your pain.  Keep your left knee elevated as much as possible.  Apply moist heat to your knee for 20 minutes at a time 2-3 times a day to help improve blood flow and decrease pain.  Discouraged her dog from jumping on her lap as this can cause more pain.  If your symptoms continue, or worsen, follow-up with orthopedics as you may need a targeted steroid injection.
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
## 2020-08-12 NOTE — ED Triage Notes (Signed)
Patient c/o left knee pain that started several weeks ago. She states she hit her left knee on something a few weeks ago and states since then she has had trouble walking.

## 2022-10-16 ENCOUNTER — Ambulatory Visit
Admission: EM | Admit: 2022-10-16 | Discharge: 2022-10-16 | Disposition: A | Discharge status: Home / Self Care | Payer: Medicaid Other | Attending: Physician Assistant | Admitting: Physician Assistant

## 2022-10-16 DIAGNOSIS — M7918 Myalgia, other site: Secondary | ICD-10-CM | POA: Diagnosis not present

## 2022-10-16 MED ORDER — TIZANIDINE HCL 2 MG PO TABS: 2.0000 mg | ORAL_TABLET | Freq: Three times a day (TID) | ORAL | 0 refills | Status: AC | PRN
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
## 2022-12-03 ENCOUNTER — Ambulatory Visit
Admission: EM | Admit: 2022-12-03 | Discharge: 2022-12-03 | Disposition: A | Discharge status: Home / Self Care | Payer: Medicaid Other | Attending: Emergency Medicine | Admitting: Emergency Medicine

## 2022-12-03 DIAGNOSIS — H5789 Other specified disorders of eye and adnexa: Secondary | ICD-10-CM

## 2022-12-03 DIAGNOSIS — H1032 Unspecified acute conjunctivitis, left eye: Secondary | ICD-10-CM | POA: Diagnosis not present

## 2022-12-03 MED ORDER — MOXIFLOXACIN HCL 0.5 % OP SOLN: 1.0000 [drp] | Freq: Three times a day (TID) | OPHTHALMIC | 0 refills | Status: DC

## 2022-12-03 MED ORDER — SYSTANE 0.4-0.3 % OP SOLN: 1.0000 [drp] | Freq: Four times a day (QID) | OPHTHALMIC | 0 refills | Status: DC | PRN

## 2022-12-03 NOTE — Discharge Instructions (Signed)
I suspect that the contact lenses has come out of your eye, however, I am certain there is an infection.  Start the Vigamox tonight.  Get 1 dose and as soon as you get the Vigamox, and do another dose in 8 hours.  Systane, cool compresses.  Dr. Druscilla Brownie will see you tomorrow morning first thing and evaluate for retained contact lens.  Tell them that the urgent care provider spoke directly with Dr. Druscilla Brownie, who wants you to come in and be seen first thing Friday morning.

## 2022-12-03 NOTE — ED Provider Notes (Signed)
HPI  SUBJECTIVE:  Brittany Holder is a 57 y.o. female who presents with left eye pain, redness, irritation, foreign body sensation, purulent drainage after putting a contact in her eye at 1430 today.  States she was unable to get it out and it feels as if it is still under her medial upper eyelid.  No nausea, vomiting, headache, photophobia, visual changes, periorbital erythema, edema, pain with EOMs.  She has been trying to remove her contact for the past 6 hours, rubbing her eye extensively.  No aggravating factors.  She has not tried anything else for this.    Past Medical History:  Diagnosis Date   Alcoholism in recovery [facility])    Anxiety     Past Surgical History:  Procedure Laterality Date   GASTRIC BYPASS     NO PAST SURGERIES      Family History  Problem Relation Age of Onset   Hypertension Mother     Social History   Tobacco Use   Smoking status: Never   Smokeless tobacco: Never  Vaping Use   Vaping status: Never Used  Substance Use Topics   Alcohol use: Not Currently   Drug use: No    No current facility-administered medications for this encounter.  Current Outpatient Medications:    disulfiram (ANTABUSE) 250 MG tablet, Take 250 mg by mouth daily., Disp: , Rfl:    FLUoxetine (PROZAC) 40 MG capsule, Take 40 mg by mouth daily., Disp: , Rfl:    gabapentin (NEURONTIN) 800 MG tablet, Take 800 mg by mouth 4 (four) times daily., Disp: , Rfl:    HYDROcodone-Acetaminophen 10-300 MG TABS, Take 1 tablet by mouth every 6 (six) hours as needed., Disp: 15 tablet, Rfl: 0   hydrOXYzine (ATARAX/VISTARIL) 10 MG tablet, Take 10 mg by mouth at bedtime., Disp: , Rfl:    moxifloxacin (VIGAMOX) 0.5 % ophthalmic solution, Place 1 drop into both eyes 3 (three) times daily. X 7 days, Disp: 3 mL, Rfl: 0   Polyethyl Glycol-Propyl Glycol (SYSTANE) 0.4-0.3 % SOLN, Apply 1 drop to eye 4 (four) times daily as needed., Disp: 5 mL, Rfl: 0   Meloxicam 15 MG TBDP, Take 1 tablet by mouth  daily., Disp: 30 tablet, Rfl: 1  No Known Allergies   ROS  As noted in HPI.   Physical Exam  BP 119/78 (BP Location: Left Arm)   Pulse 87   Temp 98.6 F (37 C) (Oral)   Ht 5\' 8"  (1.727 m)   Wt 106.1 kg   LMP 07/22/2020   SpO2 97%   BMI 35.58 kg/m   Constitutional: Well developed, well nourished, no acute distress Eyes:  EOMI, PERRLA, no direct or consensual photophobia.  Diffuse left conjunctival injection with swollen sclera.  Positive extensive purulent drainage.  No foreign body appreciated despite good lid retraction and fluorescein staining.  No pain with EOMs.  No corneal abrasion seen on fluorescein exam.        Visual Acuity  Right Eye Distance: 20/40 Left Eye Distance: 20/200 Bilateral Distance: 20/40  Right Eye Near:   Left Eye Near:    Bilateral Near:   (Pt wears contacts)   HENT: Normocephalic, atraumatic,mucus membranes moist Respiratory: Normal inspiratory effort Cardiovascular: Normal rate GI: nondistended skin: No rash, skin intact Musculoskeletal: no deformities Neurologic: Alert & oriented x 3, no focal neuro deficits Psychiatric: Speech and behavior appropriate   ED Course   Medications - No data to display  Orders Placed This Encounter  Procedures   Visual  acuity screening    Standing Status:   Standing    Number of Occurrences:   1    No results found for this or any previous visit (from the past 24 hour(s)). No results found.  ED Clinical Impression  1. Acute conjunctivitis of left eye, unspecified acute conjunctivitis type   2. Contact lens stuck      ED Assessment/Plan    I am unable to find the contact lens despite good lid retraction.  I suspect that it is out and that her eye is swollen and irritated from her rubbing at it. Spoke with Dr. Druscilla Brownie, ophthalmology on-call.  He agrees with plan to put her on Vigamox and he will see her first thing tomorrow at the [facility] in [place] to evaluate for  retained foreign body.  Will advise cool compresses and Systane.  Discussed MDM, treatment plan, and plan for follow-up with patient. patient agrees with plan.   Meds ordered this encounter  Medications   moxifloxacin (VIGAMOX) 0.5 % ophthalmic solution    Sig: Place 1 drop into both eyes 3 (three) times daily. X 7 days    Dispense:  3 mL    Refill:  0   Polyethyl Glycol-Propyl Glycol (SYSTANE) 0.4-0.3 % SOLN    Sig: Apply 1 drop to eye 4 (four) times daily as needed.    Dispense:  5 mL    Refill:  0      *This clinic note was created using Scientist, clinical (histocompatibility and immunogenetics). Therefore, there may be occasional mistakes despite careful proofreading.  ?    Domenick Gong, MD 12/03/22 2107

## 2022-12-03 NOTE — ED Triage Notes (Signed)
Pt c/o left eye irritation x1day  Pt states that her contacts are old and need to be changed. Pt states that her contacts have rolled back into her eye.  Pt left sclera is red and states that she has been pressing at her eye since 2:30pm

## 2023-03-29 ENCOUNTER — Other Ambulatory Visit: Payer: Self-pay | Admitting: Family Medicine

## 2023-03-29 DIAGNOSIS — E041 Nontoxic single thyroid nodule: Secondary | ICD-10-CM

## 2023-04-05 ENCOUNTER — Ambulatory Visit: Admission: RE | Admit: 2023-04-05 | Payer: Medicaid Other | Source: Ambulatory Visit

## 2023-05-14 ENCOUNTER — Institutional Professional Consult (permissible substitution): Payer: Medicaid Other | Admitting: Plastic Surgery

## 2023-08-20 ENCOUNTER — Encounter: Payer: Self-pay | Admitting: Emergency Medicine

## 2023-08-20 ENCOUNTER — Ambulatory Visit: Admission: EM | Admit: 2023-08-20 | Discharge: 2023-08-20 | Disposition: A | Discharge status: Home / Self Care

## 2023-08-20 DIAGNOSIS — K219 Gastro-esophageal reflux disease without esophagitis: Secondary | ICD-10-CM

## 2023-08-20 NOTE — Discharge Instructions (Addendum)
 Continue omeprazole Talk to PCP about compound GLP-1 use and dosage Avoid spicy,greasy,fried foods Drink plenty of fluids Go to Er for new or worsening issues or concerns

## 2023-08-20 NOTE — ED Provider Notes (Signed)
 [facility]    CSN: [id number] Arrival date & time: 08/20/23  1211      History   Chief Complaint Chief Complaint  Patient presents with   Nausea    HPI Brittany Holder is a 58 y.o. female.   58 year old female, Brittany Holder, presents to urgent care for evaluation of burning sensation in throat and mouth.  Patient states she has taken omeprazole that has helped some.  Patient states she is taking a compounded GLP-1 injection(recently increased dosage), denies abdominal pain , patient states she vomited only once and had 1 episode of diarrhea last night , denies any fever patient also states that she has tried new CBD gummy  The history is provided by the patient. No language interpreter was used.    Past Medical History:  Diagnosis Date   Alcoholism in recovery [facility])    Anxiety     Patient Active Problem List   Diagnosis Date Noted   Gastroesophageal reflux disease 08/20/2023    Past Surgical History:  Procedure Laterality Date   GASTRIC BYPASS     NO PAST SURGERIES      OB History   No obstetric history on file.      Home Medications    Prior to Admission medications   Medication Sig Start Date End Date Taking? Authorizing Provider  FLUoxetine (PROZAC) 40 MG capsule Take 40 mg by mouth daily.   Yes [provider]  gabapentin (NEURONTIN) 800 MG tablet Take 800 mg by mouth 4 (four) times daily. 07/24/19  Yes [provider]  hydrOXYzine (ATARAX/VISTARIL) 10 MG tablet Take 10 mg by mouth at bedtime. 07/12/19  Yes [provider]  disulfiram (ANTABUSE) 250 MG tablet Take 250 mg by mouth daily.    [provider]  HYDROcodone -Acetaminophen  10-300 MG TABS Take 1 tablet by mouth every 6 (six) hours as needed. 08/12/20   Kent Pear, NP  Meloxicam  15 MG TBDP Take 1 tablet by mouth daily. 08/12/20   Kent Pear, NP  moxifloxacin  (VIGAMOX ) 0.5 % ophthalmic solution Place 1 drop into both eyes 3 (three) times  daily. X 7 days 12/03/22   Ethlyn Herd, MD  Polyethyl Glycol-Propyl Glycol (SYSTANE) 0.4-0.3 % SOLN Apply 1 drop to eye 4 (four) times daily as needed. 12/03/22   Ethlyn Herd, MD    Family History Family History  Problem Relation Age of Onset   Hypertension Mother     Social History Social History   Tobacco Use   Smoking status: Never   Smokeless tobacco: Never  Vaping Use   Vaping status: Never Used  Substance Use Topics   Alcohol use: Not Currently   Drug use: No     Allergies   Patient has no known allergies.   Review of Systems Review of Systems  Constitutional:  Negative for fever.  HENT:  Positive for sore throat.   Gastrointestinal:  Positive for diarrhea, nausea and vomiting. Negative for abdominal pain.  All other systems reviewed and are negative.    Physical Exam Triage Vital Signs ED Triage Vitals  Encounter Vitals Group     BP 08/20/23 1240 93/74     Systolic BP Percentile --      Diastolic BP Percentile --      Pulse Rate 08/20/23 1240 68     Resp 08/20/23 1240 16     Temp 08/20/23 1240 98.4 F (36.9 C)     Temp Source 08/20/23 1240 Oral     SpO2 08/20/23  1240 97 %     Weight 08/20/23 1238 233 lb 14.5 oz (106.1 kg)     Height 08/20/23 1238 5\' 8"  (1.727 m)     Head Circumference --      Peak Flow --      Pain Score 08/20/23 1238 6     Pain Loc --      Pain Education --      Exclude from Growth Chart --    No data found.  Updated Vital Signs BP 93/74 (BP Location: Right Arm)   Pulse 68   Temp 98.4 F (36.9 C) (Oral)   Resp 16   Ht 5\' 8"  (1.727 m)   Wt 233 lb 14.5 oz (106.1 kg)   LMP 07/22/2020   SpO2 97%   BMI 35.57 kg/m   Visual Acuity Right Eye Distance:   Left Eye Distance:   Bilateral Distance:    Right Eye Near:   Left Eye Near:    Bilateral Near:     Physical Exam Vitals and nursing note reviewed.  Constitutional:      General: She is not in acute distress.    Appearance: She is well-developed and  well-groomed.  HENT:     Head: Normocephalic and atraumatic.  Eyes:     Conjunctiva/sclera: Conjunctivae normal.  Cardiovascular:     Rate and Rhythm: Normal rate and regular rhythm.     Heart sounds: Normal heart sounds. No murmur heard. Pulmonary:     Effort: Pulmonary effort is normal. No respiratory distress.     Breath sounds: Normal breath sounds and air entry.  Abdominal:     General: Bowel sounds are normal.     Palpations: Abdomen is soft.     Tenderness: There is no abdominal tenderness.  Musculoskeletal:        General: No swelling.     Cervical back: Neck supple.  Skin:    General: Skin is warm and dry.     Capillary Refill: Capillary refill takes less than 2 seconds.  Neurological:     General: No focal deficit present.     Mental Status: She is alert and oriented to person, place, and time.     GCS: GCS eye subscore is 4. GCS verbal subscore is 5. GCS motor subscore is 6.  Psychiatric:        Attention and Perception: Attention normal.        Mood and Affect: Mood normal.        Speech: Speech normal.        Behavior: Behavior normal. Behavior is cooperative.      UC Treatments / Results  Labs (all labs ordered are listed, but only abnormal results are displayed) Labs Reviewed - No data to display  EKG   Radiology No results found.  Procedures Procedures (including critical care time)  Medications Ordered in UC Medications - No data to display  Initial Impression / Assessment and Plan / UC Course  I have reviewed the triage vital signs and the nursing notes.  Pertinent labs & imaging results that were available during my care of the patient were reviewed by me and considered in my medical decision making (see chart for details).    Discussed exam findings and plan of care with patient, strict go to ER precautions given.   Patient verbalized understanding to this provider.  Ddx: GERD, nausea , vomiting , gastritis , pancreatitis , side effect of  GLP-1 medicine Final Clinical Impressions(s) / UC Diagnoses  Final diagnoses:  Gastroesophageal reflux disease, unspecified whether esophagitis present     Discharge Instructions      Continue omeprazole Talk to PCP about compound GLP-1 use and dosage Avoid spicy,greasy,fried foods Drink plenty of fluids Go to Er for new or worsening issues or concerns     ED Prescriptions   None    PDMP not reviewed this encounter.   Peter Brands, NP 08/20/23 2021

## 2023-08-20 NOTE — ED Triage Notes (Signed)
 Pt c/o nausea, burning sensation in her throat and mouth. She states she has taken omeprazole and has helped some. She states she has been taking a new CBD gummy. She is taking a weight loss GLP-1 compound. She denies abdominal pain. She states she only vomited once and had an episode of diarrhea last night. Denies fever.

## 2023-09-10 NOTE — Progress Notes (Signed)
 No show

## 2023-09-13 ENCOUNTER — Ambulatory Visit: Admitting: Internal Medicine

## 2023-10-05 ENCOUNTER — Ambulatory Visit: Payer: Self-pay | Admitting: Emergency Medicine

## 2023-10-05 ENCOUNTER — Ambulatory Visit
Admission: EM | Admit: 2023-10-05 | Discharge: 2023-10-05 | Disposition: A | Discharge status: Home / Self Care | Attending: Emergency Medicine | Admitting: Emergency Medicine

## 2023-10-05 DIAGNOSIS — R110 Nausea: Secondary | ICD-10-CM | POA: Diagnosis not present

## 2023-10-05 DIAGNOSIS — T50901A Poisoning by unspecified drugs, medicaments and biological substances, accidental (unintentional), initial encounter: Secondary | ICD-10-CM | POA: Diagnosis present

## 2023-10-05 LAB — BASIC METABOLIC PANEL WITH GFR
Anion gap: 11 (ref 5–15)
BUN: 21 mg/dL — ABNORMAL HIGH (ref 6–20)
CO2: 23 mmol/L (ref 22–32)
Calcium: 9.5 mg/dL (ref 8.9–10.3)
Chloride: 102 mmol/L (ref 98–111)
Creatinine, Ser: 0.96 mg/dL (ref 0.44–1.00)
GFR, Estimated: >60 mL/min (ref >60)
Glucose, Bld: 82 mg/dL (ref 70–99)
Potassium: 3.5 mmol/L (ref 3.5–5.1)
Sodium: 136 mmol/L (ref 135–145)

## 2023-10-05 MED ORDER — ONDANSETRON 8 MG PO TBDP: 8.0000 mg | ORAL_TABLET | Freq: Three times a day (TID) | ORAL | 0 refills | Status: DC | PRN

## 2023-10-05 MED ORDER — ONDANSETRON 8 MG PO TBDP
8.0000 mg | ORAL_TABLET | Freq: Once | ORAL | Status: AC
  Administered 2023-10-05: 8 mg via ORAL

## 2023-10-05 NOTE — Discharge Instructions (Addendum)
 I suspect that this is from doubling your semaglutide dose.  This will get better, it could take up to a week to a week and a half for your symptoms to fully resolve.  Try the 8 mg of Zofran up to 3 times a day.  This may cause constipation.  Continue drinking high-calorie liquids.  Reach out to [facility] and see if they have any other suggestions on how to make you feel better while you recover.  I will contact you if and only if your basic metabolic panel comes back abnormal.

## 2023-10-05 NOTE — ED Triage Notes (Addendum)
 Sx since Friday  Night sweats Nausea   Patient is taking semaglutide  and went up on her shot and upped her dosage.  Patient states that she took her dogs gabapentin by mistake.

## 2023-10-05 NOTE — ED Provider Notes (Signed)
 HPI  SUBJECTIVE:  Brittany Holder is a 58 y.o. female who presents with severe nausea, decreased appetite over the past 5 days after doubling her compounded semaglutide dose from 1.7 to 2.4 mg on her own after leaving it out on the counter.  She also reports taking 1600 mg of her dog's Neurontin 5 days ago.  None since.  She called poison control for this, and was reassured.  States that she is gradually getting better.  No fevers, abdominal pain, fatigue, body aches, chest pain, shortness of breath, diarrhea, constipation.  She has been drinking high-calorie liquids, sitting in the sun, taking showers without much improvement in her symptoms.  States that she is tolerating p.o. although she is having to make herself eat.  No aggravating factors.  No other recent change in her medications, urinary complaints.  She is on gabapentin for depression/anxiety.  She also has a history of GERD, uterine fibroids, and a thyroid nodule recently found on MRI.  No history of diabetes, hypertension.  LMP: 8 months ago.  She is perimenopausal.  PCP: Daisey Dryer.  Weight loss: [facility].    Past Medical History:  Diagnosis Date   Alcoholism in recovery [facility])    Anxiety     Past Surgical History:  Procedure Laterality Date   GASTRIC BYPASS     NO PAST SURGERIES      Family History  Problem Relation Age of Onset   Hypertension Mother     Social History   Tobacco Use   Smoking status: Never   Smokeless tobacco: Never  Vaping Use   Vaping status: Never Used  Substance Use Topics   Alcohol use: Not Currently   Drug use: No    No current facility-administered medications for this encounter.  Current Outpatient Medications:    FLUoxetine (PROZAC) 40 MG capsule, Take 40 mg by mouth daily., Disp: , Rfl:    gabapentin (NEURONTIN) 800 MG tablet, Take 800 mg by mouth 4 (four) times daily., Disp: , Rfl:    ondansetron (ZOFRAN-ODT) 8 MG disintegrating tablet, Take 1 tablet (8 mg  total) by mouth every 8 (eight) hours as needed for nausea or vomiting., Disp: 30 tablet, Rfl: 0   Semaglutide-Weight Management 1.7 MG/0.75ML SOAJ, Inject 1.7 mg into the skin., Disp: , Rfl:    disulfiram (ANTABUSE) 250 MG tablet, Take 250 mg by mouth daily., Disp: , Rfl:    hydrOXYzine (ATARAX/VISTARIL) 10 MG tablet, Take 10 mg by mouth at bedtime., Disp: , Rfl:    Meloxicam  15 MG TBDP, Take 1 tablet by mouth daily., Disp: 30 tablet, Rfl: 1  No Known Allergies   ROS  As noted in HPI.   Physical Exam  BP 94/66 (BP Location: Left Arm)   Pulse 74   Temp 98.5 F (36.9 C) (Oral)   Resp 16   LMP 07/22/2020   SpO2 100%   Constitutional: Well developed, well nourished, no acute distress Eyes:  EOMI, conjunctiva normal bilaterally HENT: Normocephalic, atraumatic,mucus membranes moist Respiratory: Normal inspiratory effort, lungs clear bilaterally Cardiovascular: Normal rate, regular rhythm, no murmurs rubs or gallops GI: nondistended, soft, nontender, active bowel sounds, no rebound, guarding. skin: No rash, skin intact Musculoskeletal: no deformities Neurologic: Alert & oriented x 3, no focal neuro deficits Psychiatric: Speech and behavior appropriate   ED Course   Medications  ondansetron (ZOFRAN-ODT) disintegrating tablet 8 mg (8 mg Oral Given 10/05/23 1439)    Orders Placed This Encounter  Procedures   Basic metabolic  panel    Standing Status:   Standing    Number of Occurrences:   1    Results for orders placed or performed during the hospital encounter of 10/05/23 (from the past 24 hours)  Basic metabolic panel     Status: Abnormal   Collection Time: 10/05/23  2:42 PM  Result Value Ref Range   Sodium 136 135 - 145 mmol/L   Potassium 3.5 3.5 - 5.1 mmol/L   Chloride 102 98 - 111 mmol/L   CO2 23 22 - 32 mmol/L   Glucose, Bld 82 70 - 99 mg/dL   BUN 21 (H) 6 - 20 mg/dL   Creatinine, Ser 1.61 0.44 - 1.00 mg/dL   Calcium 9.5 8.9 - 09.6 mg/dL   GFR, Estimated >04  >54 mL/min   Anion gap 11 5 - 15   No results found.  ED Clinical Impression  1. Nausea   2. Accidental overdose, initial encounter      ED Assessment/Plan     Suspect accidental overdose of semaglutide.  She is tolerating p.o., abdomen benign.  Will check basic metabolic panel for renal function.  Will contact patient [phone] for any abnormalities that require intervention.  Will have her continue drinking high-calorie liquids, send home with Zofran 8 mg 3 times daily and have her reach out to [facility] medical weight loss to see if they have any other suggestions.  Basic metabolic panel with slightly elevated BUN, do not think that this is clinically significant.  Normal creatinine/kidney function, normal electrolytes.  Plan as above.  Discussed labs,  MDM, treatment plan, and plan for follow-up with patient. . patient agrees with plan.   Meds ordered this encounter  Medications   ondansetron (ZOFRAN-ODT) disintegrating tablet 8 mg   ondansetron (ZOFRAN-ODT) 8 MG disintegrating tablet    Sig: Take 1 tablet (8 mg total) by mouth every 8 (eight) hours as needed for nausea or vomiting.    Dispense:  30 tablet    Refill:  0      *This clinic note was created using Scientist, clinical (histocompatibility and immunogenetics). Therefore, there may be occasional mistakes despite careful proofreading.  ?    Ethlyn Herd, MD 10/05/23 [phone]

## 2023-10-06 MED ORDER — ONDANSETRON 8 MG PO TBDP: 8.0000 mg | ORAL_TABLET | Freq: Three times a day (TID) | ORAL | 0 refills | Status: AC | PRN

## 2024-04-25 ENCOUNTER — Ambulatory Visit
Admission: EM | Admit: 2024-04-25 | Discharge: 2024-04-25 | Disposition: A | Discharge status: Home / Self Care | Attending: Emergency Medicine | Admitting: Emergency Medicine

## 2024-04-25 DIAGNOSIS — J029 Acute pharyngitis, unspecified: Secondary | ICD-10-CM | POA: Insufficient documentation

## 2024-04-25 DIAGNOSIS — J069 Acute upper respiratory infection, unspecified: Secondary | ICD-10-CM | POA: Diagnosis not present

## 2024-04-25 LAB — POCT INFLUENZA A/B
Influenza A, POC: NEGATIVE
Influenza B, POC: NEGATIVE

## 2024-04-25 LAB — POCT RAPID STREP A (OFFICE): Rapid Strep A Screen: NEGATIVE

## 2024-04-25 MED ORDER — FLUTICASONE PROPIONATE 50 MCG/ACT NA SUSP: 2.0000 | Freq: Every day | NASAL | 0 refills | Status: AC

## 2024-04-25 MED ORDER — BENZONATATE 200 MG PO CAPS: 200.0000 mg | ORAL_CAPSULE | Freq: Three times a day (TID) | ORAL | 0 refills | Status: AC | PRN

## 2024-04-25 MED ORDER — IBUPROFEN 600 MG PO TABS: 600.0000 mg | ORAL_TABLET | Freq: Four times a day (QID) | ORAL | 0 refills | Status: AC | PRN

## 2024-04-25 NOTE — Discharge Instructions (Addendum)
 We will contact you if your strep culture comes back positive and we will call in the appropriate antibiotics.  If your flu is positive, I will call in Tamiflu  Your rapid strep was negative today.  We have sent it off for culture to confirm absence of strep throat.  If it is positive, we will contact you and call in the appropriate antibiotics.  1 gram of Tylenol  and 600 mg ibuprofen together 3-4 times a day as needed for pain.  Make sure you drink plenty of extra fluids.  Some people find salt water gargles and  Traditional Medicinal's Throat Coat tea helpful. Take 5 mL of liquid Benadryl and 5 mL of Maalox/Mylanta. Mix it together, and then hold it in your mouth for as long as you can and then swallow. You may do this 4 times a day.  Honey and lemon dissolved in hot water can also be soothing.  Flonase, saline nasal irrigation with a NeilMed rinse and distilled water as often as you want, Mucinex D for nasal congestion.  Tessalon for cough.  Go to www.goodrx.com  or www.costplusdrugs.com to look up your medications. This will give you a list of where you can find your prescriptions at the most affordable prices. Or ask the pharmacist what the cash price is, or if they have any other discount programs available to help make your medication more affordable. This can be less expensive than what you would pay with insurance.

## 2024-04-25 NOTE — ED Triage Notes (Signed)
 Sx x 1 days  Sore throat Productive cough Patient states that she noticed a knot on her throat yesterday  No fever  Headache

## 2024-04-25 NOTE — ED Provider Notes (Signed)
 " HPI  SUBJECTIVE:  Brittany Holder is a 58 y.o. female who presents with 2 days of sore throat that is worse with coughing, cough productive of phlegm, a knot on her left neck, nasal congestion, rhinorrhea, hoarse voice and headache.  No fevers, body aches, wheezing, shortness of breath, sensation of throat swelling shut, muffled voice, neck stiffness.  She is waking up at night coughing.  No GERD symptoms.  No known exposure to COVID, flu, strep.  No antibiotics in the past month.  No antipyretic in the past 6 hours.  She has tried NyQuil with improvement in symptoms.  Symptoms are worse with coughing.  She has no past medical history.    Past Medical History:  Diagnosis Date   Alcoholism in recovery [facility])    Anxiety     Past Surgical History:  Procedure Laterality Date   GASTRIC BYPASS     NO PAST SURGERIES      Family History  Problem Relation Age of Onset   Hypertension Mother     Social History[1]  Current Medications[2]  Allergies[3]   ROS  As noted in HPI.   Physical Exam  BP 109/84 (BP Location: Left Arm)   Pulse 73   Temp 98.6 F (37 C) (Oral)   Resp 16   Wt 86.2 kg   LMP 07/22/2020   SpO2 100%   BMI 28.89 kg/m   Constitutional: Well developed, well nourished, no acute distress Eyes: PERRL, EOMI, conjunctiva normal bilaterally HENT: Normocephalic, atraumatic,mucus membranes moist.  Positive nasal congestion.  Normal nasal mucosa.  No maxillary, frontal sinus tenderness.  Erythematous, irritated oropharynx.  Tonsils normal size without exudates.  Uvula midline.  Positive postnasal drip.  No drooling, trismus, neck stiffness.  Raspy, but not muffled voice. Neck: Positive left-sided cervical lymphadenopathy Respiratory: Clear to auscultation bilaterally, no rales, no wheezing, no rhonchi Cardiovascular: Normal rate and rhythm, no murmurs, no gallops, no rubs GI: nondistended.  No appreciable splenomegaly skin: No rash, skin  intact Musculoskeletal: no deformities Neurologic: Alert & oriented x 3, CN III-XII grossly intact, no motor deficits, sensation grossly intact Psychiatric: Speech and behavior appropriate   ED Course   Medications - No data to display  Orders Placed This Encounter  Procedures   Culture, group A strep (throat)    Standing Status:   Standing    Number of Occurrences:   1   POC rapid strep A    Standing Status:   Standing    Number of Occurrences:   1   POC Influenza A/B    Standing Status:   Standing    Number of Occurrences:   1   Results for orders placed or performed during the hospital encounter of 04/25/24 (from the past 24 hours)  POC rapid strep A     Status: Normal   Collection Time: 04/25/24 12:30 PM  Result Value Ref Range   Rapid Strep A Screen Negative Negative  POC Influenza A/B     Status: Normal   Collection Time: 04/25/24  1:55 PM  Result Value Ref Range   Influenza A, POC Negative Negative   Influenza B, POC Negative Negative   No results found.  ED Clinical Impression  1. Sore throat   2. Upper respiratory tract infection, unspecified type      ED Assessment/Plan     Strep negative.  Will send off throat culture. will also check influenza.  Will base further treatment off of labs.  No evidence of an  emergent cause of sore throat at this time.  In the meantime, Benadryl/Maalox mixture, Flonase, saline nasal irrigation, Mucinex D, Tylenol  combined with ibuprofen 3-4 times a day as needed for pain, Tessalon for cough.  ER return precautions given.  Influenza negative.  Plan as above  Discussed labs, MDM, treatment plan, and plan for follow-up with patient Discussed sn/sx that should prompt return to the ED. patient agrees with plan.   Meds ordered this encounter  Medications   fluticasone (FLONASE) 50 MCG/ACT nasal spray    Sig: Place 2 sprays into both nostrils daily.    Dispense:  16 g    Refill:  0   ibuprofen (ADVIL) 600 MG tablet    Sig:  Take 1 tablet (600 mg total) by mouth every 6 (six) hours as needed.    Dispense:  30 tablet    Refill:  0   benzonatate (TESSALON) 200 MG capsule    Sig: Take 1 capsule (200 mg total) by mouth 3 (three) times daily as needed for cough.    Dispense:  30 capsule    Refill:  0      *This clinic note was created using Scientist, clinical (histocompatibility and immunogenetics). Therefore, there may be occasional mistakes despite careful proofreading. ?      [1]  Social History Tobacco Use   Smoking status: Never   Smokeless tobacco: Never  Vaping Use   Vaping status: Never Used  Substance Use Topics   Alcohol use: Not Currently   Drug use: No  [2] No current facility-administered medications for this encounter.  Current Outpatient Medications:    benzonatate (TESSALON) 200 MG capsule, Take 1 capsule (200 mg total) by mouth 3 (three) times daily as needed for cough., Disp: 30 capsule, Rfl: 0   FLUoxetine (PROZAC) 40 MG capsule, Take 40 mg by mouth daily., Disp: , Rfl:    fluticasone (FLONASE) 50 MCG/ACT nasal spray, Place 2 sprays into both nostrils daily., Disp: 16 g, Rfl: 0   ibuprofen (ADVIL) 600 MG tablet, Take 1 tablet (600 mg total) by mouth every 6 (six) hours as needed., Disp: 30 tablet, Rfl: 0   Semaglutide-Weight Management 1.7 MG/0.75ML SOAJ, Inject 1.7 mg into the skin., Disp: , Rfl:    disulfiram (ANTABUSE) 250 MG tablet, Take 250 mg by mouth daily., Disp: , Rfl:    gabapentin (NEURONTIN) 800 MG tablet, Take 800 mg by mouth 4 (four) times daily., Disp: , Rfl:    hydrOXYzine (ATARAX/VISTARIL) 10 MG tablet, Take 10 mg by mouth at bedtime., Disp: , Rfl:    Meloxicam  15 MG TBDP, Take 1 tablet by mouth daily., Disp: 30 tablet, Rfl: 1   ondansetron  (ZOFRAN -ODT) 8 MG disintegrating tablet, Take 1 tablet (8 mg total) by mouth every 8 (eight) hours as needed for nausea or vomiting., Disp: 30 tablet, Rfl: 0 [3] No Known Allergies    Van Knee, MD 04/26/24 1134  "

## 2024-04-28 ENCOUNTER — Ambulatory Visit (HOSPITAL_COMMUNITY): Payer: Self-pay

## 2024-04-28 ENCOUNTER — Other Ambulatory Visit: Payer: Self-pay | Admitting: Medical Genetics

## 2024-04-28 LAB — CULTURE, GROUP A STREP (THRC)

## 2024-05-13 ENCOUNTER — Other Ambulatory Visit: Payer: Self-pay | Attending: Medical Genetics

## 2024-05-17 ENCOUNTER — Encounter (INDEPENDENT_AMBULATORY_CARE_PROVIDER_SITE_OTHER): Payer: Self-pay
